# Patient Record
Sex: Female | Born: 1935
Health system: Southern US, Community
[De-identification: ages and names within clinical notes are randomized; demographics above are authoritative.]

---

## 2007-01-11 ENCOUNTER — Inpatient Hospital Stay: Payer: Self-pay | Admitting: Internal Medicine

## 2007-01-11 ENCOUNTER — Other Ambulatory Visit: Payer: Self-pay

## 2007-02-08 ENCOUNTER — Ambulatory Visit: Payer: Self-pay | Admitting: Gastroenterology

## 2007-12-08 ENCOUNTER — Other Ambulatory Visit: Payer: Self-pay

## 2007-12-08 ENCOUNTER — Emergency Department: Payer: Self-pay | Admitting: Emergency Medicine

## 2007-12-09 ENCOUNTER — Inpatient Hospital Stay: Payer: Self-pay | Admitting: Internal Medicine

## 2007-12-09 ENCOUNTER — Other Ambulatory Visit: Payer: Self-pay

## 2007-12-23 ENCOUNTER — Other Ambulatory Visit: Payer: Self-pay

## 2007-12-23 ENCOUNTER — Emergency Department: Payer: Self-pay | Admitting: Unknown Physician Specialty

## 2007-12-31 ENCOUNTER — Other Ambulatory Visit: Payer: Self-pay

## 2007-12-31 ENCOUNTER — Inpatient Hospital Stay: Payer: Self-pay | Admitting: Internal Medicine

## 2008-01-17 ENCOUNTER — Ambulatory Visit: Payer: Self-pay | Admitting: Gastroenterology

## 2009-04-08 ENCOUNTER — Inpatient Hospital Stay: Payer: Self-pay | Admitting: Internal Medicine

## 2009-04-17 ENCOUNTER — Inpatient Hospital Stay: Payer: Self-pay | Admitting: Internal Medicine

## 2010-12-06 ENCOUNTER — Emergency Department: Payer: Self-pay | Admitting: *Deleted

## 2010-12-17 ENCOUNTER — Emergency Department: Payer: Self-pay | Admitting: Emergency Medicine

## 2011-02-21 ENCOUNTER — Emergency Department: Payer: Self-pay | Admitting: Emergency Medicine

## 2011-09-28 ENCOUNTER — Emergency Department: Payer: Self-pay | Admitting: Emergency Medicine

## 2011-09-28 LAB — COMPREHENSIVE METABOLIC PANEL
Albumin: 3.6 g/dL (ref 3.4–5.0)
Alkaline Phosphatase: 60 U/L (ref 50–136)
Anion Gap: 6 — ABNORMAL LOW (ref 7–16)
Bilirubin,Total: 0.4 mg/dL (ref 0.2–1.0)
Calcium, Total: 9 mg/dL (ref 8.5–10.1)
Chloride: 101 mmol/L (ref 98–107)
Co2: 23 mmol/L (ref 21–32)
Creatinine: 1.01 mg/dL (ref 0.60–1.30)
EGFR (African American): >60
EGFR (Non-African Amer.): 55 — ABNORMAL LOW
Potassium: 4.9 mmol/L (ref 3.5–5.1)
SGPT (ALT): 18 U/L
Sodium: 130 mmol/L — ABNORMAL LOW (ref 136–145)
Total Protein: 6.5 g/dL (ref 6.4–8.2)

## 2011-09-28 LAB — CBC
HCT: 34.3 % — ABNORMAL LOW (ref 35.0–47.0)
HGB: 11.4 g/dL — ABNORMAL LOW (ref 12.0–16.0)
MCV: 93 fL (ref 80–100)
Platelet: 145 10*3/uL — ABNORMAL LOW (ref 150–440)
RBC: 3.71 10*6/uL — ABNORMAL LOW (ref 3.80–5.20)
WBC: 3.9 10*3/uL (ref 3.6–11.0)

## 2011-09-28 LAB — LIPASE, BLOOD: Lipase: 110 U/L (ref 73–393)

## 2012-05-30 ENCOUNTER — Emergency Department: Payer: Self-pay | Admitting: Emergency Medicine

## 2012-05-30 LAB — BASIC METABOLIC PANEL
Calcium, Total: 8.6 mg/dL (ref 8.5–10.1)
Chloride: 100 mmol/L (ref 98–107)
Co2: 23 mmol/L (ref 21–32)
EGFR (Non-African Amer.): 54 — ABNORMAL LOW
Glucose: 115 mg/dL — ABNORMAL HIGH (ref 65–99)
Osmolality: 262 (ref 275–301)
Potassium: 5.5 mmol/L — ABNORMAL HIGH (ref 3.5–5.1)
Sodium: 130 mmol/L — ABNORMAL LOW (ref 136–145)

## 2012-05-30 LAB — CBC
HGB: 12.9 g/dL (ref 12.0–16.0)
MCH: 31.4 pg (ref 26.0–34.0)
MCHC: 34.1 g/dL (ref 32.0–36.0)
Platelet: 170 10*3/uL (ref 150–440)
RDW: 13.8 % (ref 11.5–14.5)

## 2012-05-30 LAB — CK TOTAL AND CKMB (NOT AT ARMC): CK, Total: 47 U/L (ref 21–215)

## 2012-07-11 ENCOUNTER — Ambulatory Visit: Payer: Self-pay | Admitting: Surgery

## 2012-10-15 LAB — COMPREHENSIVE METABOLIC PANEL
Alkaline Phosphatase: 79 U/L (ref 50–136)
Anion Gap: 5 — ABNORMAL LOW (ref 7–16)
Creatinine: 1.43 mg/dL — ABNORMAL HIGH (ref 0.60–1.30)
Osmolality: 273 (ref 275–301)
SGOT(AST): 18 U/L (ref 15–37)
SGPT (ALT): 14 U/L (ref 12–78)
Total Protein: 6.8 g/dL (ref 6.4–8.2)

## 2012-10-15 LAB — URINALYSIS, COMPLETE
Bacteria: NONE SEEN
Blood: NEGATIVE
Glucose,UR: NEGATIVE mg/dL (ref 0–75)
Hyaline Cast: 24
Ketone: NEGATIVE
Ph: 5 (ref 4.5–8.0)
Protein: 30
RBC,UR: 4 /HPF (ref 0–5)
Specific Gravity: 1.023 (ref 1.003–1.030)
WBC UR: 45 /HPF (ref 0–5)

## 2012-10-15 LAB — CBC
HCT: 36.3 % (ref 35.0–47.0)
MCH: 30.2 pg (ref 26.0–34.0)
MCV: 91 fL (ref 80–100)
RBC: 4.01 10*6/uL (ref 3.80–5.20)
WBC: 7.7 10*3/uL (ref 3.6–11.0)

## 2012-10-15 LAB — CK TOTAL AND CKMB (NOT AT ARMC): CK, Total: 125 U/L (ref 21–215)

## 2012-10-15 LAB — TROPONIN I: Troponin-I: <0.02 ng/mL

## 2012-10-16 ENCOUNTER — Inpatient Hospital Stay: Payer: Self-pay | Admitting: Internal Medicine

## 2012-10-16 LAB — CK TOTAL AND CKMB (NOT AT ARMC)
CK, Total: 119 U/L (ref 21–215)
CK, Total: 103 U/L (ref 21–215)
CK-MB: 1.3 ng/mL (ref 0.5–3.6)
CK-MB: 1.2 ng/mL (ref 0.5–3.6)

## 2012-10-17 DIAGNOSIS — I059 Rheumatic mitral valve disease, unspecified: Secondary | ICD-10-CM

## 2012-10-17 LAB — BASIC METABOLIC PANEL
Calcium, Total: 8.2 mg/dL — ABNORMAL LOW (ref 8.5–10.1)
Chloride: 104 mmol/L (ref 98–107)
Co2: 24 mmol/L (ref 21–32)
Glucose: 76 mg/dL (ref 65–99)
Osmolality: 267 (ref 275–301)
Potassium: 4.9 mmol/L (ref 3.5–5.1)
Sodium: 134 mmol/L — ABNORMAL LOW (ref 136–145)

## 2012-10-17 LAB — URINE CULTURE

## 2012-10-22 ENCOUNTER — Emergency Department: Payer: Self-pay | Admitting: Emergency Medicine

## 2012-10-22 LAB — CBC
HCT: 34 % — ABNORMAL LOW (ref 35.0–47.0)
MCHC: 33.4 g/dL (ref 32.0–36.0)
Platelet: 176 10*3/uL (ref 150–440)
RBC: 3.78 10*6/uL — ABNORMAL LOW (ref 3.80–5.20)
RDW: 13.5 % (ref 11.5–14.5)
WBC: 5.5 10*3/uL (ref 3.6–11.0)

## 2012-10-22 LAB — COMPREHENSIVE METABOLIC PANEL
Alkaline Phosphatase: 67 U/L (ref 50–136)
Anion Gap: 6 — ABNORMAL LOW (ref 7–16)
Bilirubin,Total: 0.2 mg/dL (ref 0.2–1.0)
Creatinine: 1.16 mg/dL (ref 0.60–1.30)
EGFR (African American): 49 — ABNORMAL LOW
Glucose: 93 mg/dL (ref 65–99)
Osmolality: 264 (ref 275–301)
Potassium: 4.9 mmol/L (ref 3.5–5.1)
SGOT(AST): 14 U/L — ABNORMAL LOW (ref 15–37)
SGPT (ALT): 16 U/L (ref 12–78)
Total Protein: 6.5 g/dL (ref 6.4–8.2)

## 2012-10-22 LAB — URINALYSIS, COMPLETE
Bilirubin,UR: NEGATIVE
Hyaline Cast: 18
Nitrite: NEGATIVE
Protein: NEGATIVE
Specific Gravity: 1.026 (ref 1.003–1.030)
WBC UR: 13 /HPF (ref 0–5)

## 2012-10-22 LAB — TROPONIN I: Troponin-I: <0.02 ng/mL

## 2012-10-22 LAB — CK TOTAL AND CKMB (NOT AT ARMC): CK, Total: 59 U/L (ref 21–215)

## 2012-11-03 ENCOUNTER — Emergency Department: Payer: Self-pay | Admitting: Emergency Medicine

## 2012-11-03 LAB — COMPREHENSIVE METABOLIC PANEL
Albumin: 3.4 g/dL (ref 3.4–5.0)
Alkaline Phosphatase: 78 U/L (ref 50–136)
Anion Gap: 5 — ABNORMAL LOW (ref 7–16)
Bilirubin,Total: 0.3 mg/dL (ref 0.2–1.0)
Calcium, Total: 8.5 mg/dL (ref 8.5–10.1)
Chloride: 100 mmol/L (ref 98–107)
Co2: 26 mmol/L (ref 21–32)
Creatinine: 0.86 mg/dL (ref 0.60–1.30)
EGFR (African American): >60
EGFR (Non-African Amer.): >60
SGOT(AST): 21 U/L (ref 15–37)
SGPT (ALT): 13 U/L (ref 12–78)
Sodium: 131 mmol/L — ABNORMAL LOW (ref 136–145)
Total Protein: 6.9 g/dL (ref 6.4–8.2)

## 2012-11-03 LAB — CBC
HCT: 35.6 % (ref 35.0–47.0)
MCH: 29.4 pg (ref 26.0–34.0)
MCHC: 33.2 g/dL (ref 32.0–36.0)
MCV: 89 fL (ref 80–100)
RBC: 4.02 10*6/uL (ref 3.80–5.20)

## 2012-11-03 LAB — URINALYSIS, COMPLETE
Bilirubin,UR: NEGATIVE
Glucose,UR: NEGATIVE mg/dL (ref 0–75)
Leukocyte Esterase: NEGATIVE
Ph: 5 (ref 4.5–8.0)
Protein: NEGATIVE
RBC,UR: <1 /HPF (ref 0–5)
Specific Gravity: 1.012 (ref 1.003–1.030)
Squamous Epithelial: <1
WBC UR: 1 /HPF (ref 0–5)

## 2012-11-03 LAB — TROPONIN I: Troponin-I: <0.02 ng/mL

## 2013-05-14 ENCOUNTER — Observation Stay: Payer: Self-pay | Admitting: Internal Medicine

## 2013-05-14 LAB — URINALYSIS, COMPLETE
Blood: NEGATIVE
Glucose,UR: NEGATIVE mg/dL (ref 0–75)
Ph: 8 (ref 4.5–8.0)
Protein: NEGATIVE
RBC,UR: 2 /HPF (ref 0–5)
Specific Gravity: 1.013 (ref 1.003–1.030)
Squamous Epithelial: <1
WBC UR: 3 /HPF (ref 0–5)

## 2013-05-14 LAB — CBC
HCT: 33.3 % — ABNORMAL LOW (ref 35.0–47.0)
MCH: 29 pg (ref 26.0–34.0)
MCHC: 34.3 g/dL (ref 32.0–36.0)
Platelet: 209 10*3/uL (ref 150–440)
WBC: 8.7 10*3/uL (ref 3.6–11.0)

## 2013-05-14 LAB — COMPREHENSIVE METABOLIC PANEL
Albumin: 3.1 g/dL — ABNORMAL LOW (ref 3.4–5.0)
Alkaline Phosphatase: 75 U/L (ref 50–136)
BUN: 11 mg/dL (ref 7–18)
Bilirubin,Total: 0.4 mg/dL (ref 0.2–1.0)
Calcium, Total: 8.8 mg/dL (ref 8.5–10.1)
Chloride: 101 mmol/L (ref 98–107)
Co2: 24 mmol/L (ref 21–32)
EGFR (African American): 59 — ABNORMAL LOW
EGFR (Non-African Amer.): 51 — ABNORMAL LOW
Glucose: 123 mg/dL — ABNORMAL HIGH (ref 65–99)
Osmolality: 262 (ref 275–301)
Potassium: 4.6 mmol/L (ref 3.5–5.1)
SGOT(AST): 18 U/L (ref 15–37)
SGPT (ALT): 13 U/L (ref 12–78)
Sodium: 130 mmol/L — ABNORMAL LOW (ref 136–145)

## 2013-05-14 LAB — PROTIME-INR: Prothrombin Time: 14.1 secs (ref 11.5–14.7)

## 2013-05-14 LAB — TROPONIN I: Troponin-I: <0.02 ng/mL

## 2013-05-14 LAB — CK TOTAL AND CKMB (NOT AT ARMC): CK-MB: 0.7 ng/mL (ref 0.5–3.6)

## 2013-05-15 LAB — CBC WITH DIFFERENTIAL/PLATELET
Basophil #: 0 10*3/uL (ref 0.0–0.1)
Eosinophil #: 0.1 10*3/uL (ref 0.0–0.7)
HCT: 32.4 % — ABNORMAL LOW (ref 35.0–47.0)
HGB: 11 g/dL — ABNORMAL LOW (ref 12.0–16.0)
Lymphocyte #: 0.7 10*3/uL — ABNORMAL LOW (ref 1.0–3.6)
Lymphocyte %: 12.9 %
MCH: 28.8 pg (ref 26.0–34.0)
MCHC: 33.8 g/dL (ref 32.0–36.0)
Monocyte #: 0.5 x10 3/mm (ref 0.2–0.9)
Monocyte %: 8.6 %
Neutrophil #: 4.3 10*3/uL (ref 1.4–6.5)
Platelet: 201 10*3/uL (ref 150–440)
RDW: 14.4 % (ref 11.5–14.5)
WBC: 5.6 10*3/uL (ref 3.6–11.0)

## 2013-05-15 LAB — COMPREHENSIVE METABOLIC PANEL
Anion Gap: 7 (ref 7–16)
Bilirubin,Total: 0.4 mg/dL (ref 0.2–1.0)
Co2: 25 mmol/L (ref 21–32)
EGFR (Non-African Amer.): 57 — ABNORMAL LOW
Osmolality: 264 (ref 275–301)
Potassium: 4.3 mmol/L (ref 3.5–5.1)

## 2013-05-15 LAB — TROPONIN I: Troponin-I: <0.02 ng/mL

## 2013-09-24 ENCOUNTER — Observation Stay: Payer: Self-pay | Admitting: Specialist

## 2013-09-24 LAB — URINALYSIS, COMPLETE
BLOOD: NEGATIVE
Bilirubin,UR: NEGATIVE
Glucose,UR: NEGATIVE mg/dL (ref 0–75)
KETONE: NEGATIVE
NITRITE: NEGATIVE
PROTEIN: NEGATIVE
Ph: 7 (ref 4.5–8.0)
RBC, UR: NONE SEEN /HPF (ref 0–5)
Specific Gravity: 1.011 (ref 1.003–1.030)

## 2013-09-24 LAB — BASIC METABOLIC PANEL
Anion Gap: 4 — ABNORMAL LOW (ref 7–16)
BUN: 9 mg/dL (ref 7–18)
CHLORIDE: 96 mmol/L — AB (ref 98–107)
CO2: 27 mmol/L (ref 21–32)
Calcium, Total: 9.2 mg/dL (ref 8.5–10.1)
Creatinine: 0.87 mg/dL (ref 0.60–1.30)
EGFR (Non-African Amer.): 60 — ABNORMAL LOW
GLUCOSE: 108 mg/dL — AB (ref 65–99)
Osmolality: 254 (ref 275–301)
Potassium: 4.7 mmol/L (ref 3.5–5.1)
Sodium: 127 mmol/L — ABNORMAL LOW (ref 136–145)

## 2013-09-24 LAB — CBC
HCT: 32.6 % — AB (ref 35.0–47.0)
HGB: 10.9 g/dL — ABNORMAL LOW (ref 12.0–16.0)
MCV: 82 fL (ref 80–100)
Platelet: 252 10*3/uL (ref 150–440)
RBC: 3.96 10*6/uL (ref 3.80–5.20)
WBC: 6.8 10*3/uL (ref 3.6–11.0)

## 2013-09-24 LAB — OSMOLALITY: Osmolality: 262 mOsm/kg — ABNORMAL LOW (ref 280–301)

## 2013-09-24 LAB — TROPONIN I: Troponin-I: <0.02 ng/mL

## 2013-09-25 LAB — BASIC METABOLIC PANEL
ANION GAP: 4 — AB (ref 7–16)
BUN: 6 mg/dL — ABNORMAL LOW (ref 7–18)
Calcium, Total: 8.4 mg/dL — ABNORMAL LOW (ref 8.5–10.1)
Chloride: 99 mmol/L (ref 98–107)
Co2: 27 mmol/L (ref 21–32)
Creatinine: 0.79 mg/dL (ref 0.60–1.30)
EGFR (African American): >60
GLUCOSE: 86 mg/dL (ref 65–99)
OSMOLALITY: 258 (ref 275–301)
Potassium: 4.7 mmol/L (ref 3.5–5.1)
Sodium: 130 mmol/L — ABNORMAL LOW (ref 136–145)

## 2013-09-25 LAB — OSMOLALITY, URINE: OSMOLALITY: 320 mosm/kg

## 2013-09-26 LAB — BASIC METABOLIC PANEL
Anion Gap: 4 — ABNORMAL LOW (ref 7–16)
BUN: 5 mg/dL — ABNORMAL LOW (ref 7–18)
CHLORIDE: 99 mmol/L (ref 98–107)
CO2: 26 mmol/L (ref 21–32)
Calcium, Total: 8.7 mg/dL (ref 8.5–10.1)
Creatinine: 0.8 mg/dL (ref 0.60–1.30)
EGFR (Non-African Amer.): >60
Glucose: 80 mg/dL (ref 65–99)
Osmolality: 255 (ref 275–301)
POTASSIUM: 4.5 mmol/L (ref 3.5–5.1)
Sodium: 129 mmol/L — ABNORMAL LOW (ref 136–145)

## 2013-10-18 LAB — URINE CULTURE

## 2013-12-02 ENCOUNTER — Emergency Department: Payer: Self-pay | Admitting: Emergency Medicine

## 2013-12-02 LAB — BASIC METABOLIC PANEL
Anion Gap: 6 — ABNORMAL LOW (ref 7–16)
BUN: 9 mg/dL (ref 7–18)
CO2: 28 mmol/L (ref 21–32)
Calcium, Total: 8.7 mg/dL (ref 8.5–10.1)
Chloride: 99 mmol/L (ref 98–107)
Creatinine: 0.71 mg/dL (ref 0.60–1.30)
EGFR (African American): >60
Glucose: 89 mg/dL (ref 65–99)
Osmolality: 265 (ref 275–301)
POTASSIUM: 4.5 mmol/L (ref 3.5–5.1)
Sodium: 133 mmol/L — ABNORMAL LOW (ref 136–145)

## 2013-12-02 LAB — TROPONIN I: Troponin-I: <0.02 ng/mL

## 2013-12-02 LAB — CBC
HCT: 31.4 % — AB (ref 35.0–47.0)
HGB: 10.4 g/dL — ABNORMAL LOW (ref 12.0–16.0)
MCH: 27.3 pg (ref 26.0–34.0)
MCHC: 33.2 g/dL (ref 32.0–36.0)
MCV: 82 fL (ref 80–100)
Platelet: 302 10*3/uL (ref 150–440)
RBC: 3.81 10*6/uL (ref 3.80–5.20)
RDW: 17.1 % — AB (ref 11.5–14.5)
WBC: 6.2 10*3/uL (ref 3.6–11.0)

## 2013-12-12 ENCOUNTER — Ambulatory Visit: Payer: Self-pay | Admitting: Family Medicine

## 2013-12-12 LAB — URINALYSIS, COMPLETE
BLOOD: NEGATIVE
Bacteria: NEGATIVE
Bilirubin,UR: NEGATIVE
GLUCOSE, UR: NEGATIVE mg/dL (ref 0–75)
Ketone: NEGATIVE
Leukocyte Esterase: NEGATIVE
Nitrite: NEGATIVE
PROTEIN: NEGATIVE
Ph: 7.5 (ref 4.5–8.0)
SPECIFIC GRAVITY: 1.005 (ref 1.003–1.030)
Squamous Epithelial: NONE SEEN

## 2013-12-14 LAB — URINE CULTURE

## 2013-12-24 ENCOUNTER — Emergency Department: Payer: Self-pay | Admitting: Emergency Medicine

## 2013-12-24 LAB — COMPREHENSIVE METABOLIC PANEL
Albumin: 2.6 g/dL — ABNORMAL LOW (ref 3.4–5.0)
Alkaline Phosphatase: 60 U/L
Anion Gap: 3 — ABNORMAL LOW (ref 7–16)
BILIRUBIN TOTAL: 0.4 mg/dL (ref 0.2–1.0)
BUN: 10 mg/dL (ref 7–18)
CO2: 28 mmol/L (ref 21–32)
Calcium, Total: 8.6 mg/dL (ref 8.5–10.1)
Chloride: 100 mmol/L (ref 98–107)
Creatinine: 0.94 mg/dL (ref 0.60–1.30)
GFR CALC NON AF AMER: 55 — AB
GLUCOSE: 89 mg/dL (ref 65–99)
OSMOLALITY: 261 (ref 275–301)
Potassium: 4.3 mmol/L (ref 3.5–5.1)
SGOT(AST): 20 U/L (ref 15–37)
SGPT (ALT): 9 U/L — ABNORMAL LOW (ref 12–78)
Sodium: 131 mmol/L — ABNORMAL LOW (ref 136–145)
TOTAL PROTEIN: 6.4 g/dL (ref 6.4–8.2)

## 2013-12-24 LAB — URINALYSIS, COMPLETE
BACTERIA: NONE SEEN
BILIRUBIN, UR: NEGATIVE
Blood: NEGATIVE
Glucose,UR: NEGATIVE mg/dL (ref 0–75)
KETONE: NEGATIVE
Leukocyte Esterase: NEGATIVE
Nitrite: NEGATIVE
Ph: 8 (ref 4.5–8.0)
Protein: NEGATIVE
SPECIFIC GRAVITY: 1.008 (ref 1.003–1.030)
Squamous Epithelial: <1
WBC UR: 1 /HPF (ref 0–5)

## 2013-12-24 LAB — CBC
HCT: 31.6 % — AB (ref 35.0–47.0)
HGB: 10.3 g/dL — ABNORMAL LOW (ref 12.0–16.0)
MCH: 27.3 pg (ref 26.0–34.0)
MCHC: 32.7 g/dL (ref 32.0–36.0)
MCV: 83 fL (ref 80–100)
PLATELETS: 288 10*3/uL (ref 150–440)
RBC: 3.79 10*6/uL — ABNORMAL LOW (ref 3.80–5.20)
RDW: 16.9 % — ABNORMAL HIGH (ref 11.5–14.5)
WBC: 6.6 10*3/uL (ref 3.6–11.0)

## 2013-12-24 LAB — PRO B NATRIURETIC PEPTIDE: B-TYPE NATIURETIC PEPTID: 736 pg/mL — AB (ref 0–450)

## 2013-12-24 LAB — TROPONIN I: Troponin-I: <0.02 ng/mL

## 2014-01-26 ENCOUNTER — Emergency Department: Payer: Self-pay | Admitting: Emergency Medicine

## 2014-01-26 LAB — CBC WITH DIFFERENTIAL/PLATELET
BASOS ABS: 0.1 10*3/uL (ref 0.0–0.1)
Basophil %: 1 %
Eosinophil #: 0.1 10*3/uL (ref 0.0–0.7)
Eosinophil %: 0.9 %
HCT: 33.5 % — AB (ref 35.0–47.0)
HGB: 10.5 g/dL — AB (ref 12.0–16.0)
LYMPHS ABS: 0.6 10*3/uL — AB (ref 1.0–3.6)
Lymphocyte %: 10.6 %
MCH: 25.9 pg — ABNORMAL LOW (ref 26.0–34.0)
MCHC: 31.2 g/dL — ABNORMAL LOW (ref 32.0–36.0)
MCV: 83 fL (ref 80–100)
Monocyte #: 0.5 x10 3/mm (ref 0.2–0.9)
Monocyte %: 9.2 %
NEUTROS ABS: 4.6 10*3/uL (ref 1.4–6.5)
Neutrophil %: 78.3 %
Platelet: 284 10*3/uL (ref 150–440)
RBC: 4.04 10*6/uL (ref 3.80–5.20)
RDW: 16.1 % — ABNORMAL HIGH (ref 11.5–14.5)
WBC: 5.9 10*3/uL (ref 3.6–11.0)

## 2014-01-26 LAB — BASIC METABOLIC PANEL
ANION GAP: 7 (ref 7–16)
BUN: 8 mg/dL (ref 7–18)
Calcium, Total: 9 mg/dL (ref 8.5–10.1)
Chloride: 95 mmol/L — ABNORMAL LOW (ref 98–107)
Co2: 25 mmol/L (ref 21–32)
Creatinine: 0.77 mg/dL (ref 0.60–1.30)
EGFR (African American): >60
GLUCOSE: 88 mg/dL (ref 65–99)
Osmolality: 253 (ref 275–301)
Potassium: 4.1 mmol/L (ref 3.5–5.1)
SODIUM: 127 mmol/L — AB (ref 136–145)

## 2014-01-26 LAB — TROPONIN I

## 2014-01-26 LAB — URINALYSIS, COMPLETE
Bilirubin,UR: NEGATIVE
GLUCOSE, UR: NEGATIVE mg/dL (ref 0–75)
Ketone: NEGATIVE
Nitrite: NEGATIVE
PROTEIN: NEGATIVE
Ph: 8 (ref 4.5–8.0)
RBC,UR: 3 /HPF (ref 0–5)
SQUAMOUS EPITHELIAL: NONE SEEN
Specific Gravity: 1.006 (ref 1.003–1.030)
WBC UR: 62 /HPF (ref 0–5)

## 2014-02-23 ENCOUNTER — Emergency Department: Payer: Self-pay | Admitting: Emergency Medicine

## 2014-02-23 LAB — BASIC METABOLIC PANEL
Anion Gap: 9 (ref 7–16)
BUN: 13 mg/dL (ref 7–18)
Calcium, Total: 8.5 mg/dL (ref 8.5–10.1)
Chloride: 99 mmol/L (ref 98–107)
Co2: 25 mmol/L (ref 21–32)
Creatinine: 1.06 mg/dL (ref 0.60–1.30)
EGFR (African American): 54 — ABNORMAL LOW
GFR CALC NON AF AMER: 47 — AB
Glucose: 76 mg/dL (ref 65–99)
Osmolality: 265 (ref 275–301)
Potassium: 4.2 mmol/L (ref 3.5–5.1)
Sodium: 133 mmol/L — ABNORMAL LOW (ref 136–145)

## 2014-02-23 LAB — CBC
HCT: 30.9 % — ABNORMAL LOW (ref 35.0–47.0)
HGB: 9.9 g/dL — ABNORMAL LOW (ref 12.0–16.0)
MCH: 26.5 pg (ref 26.0–34.0)
MCHC: 32 g/dL (ref 32.0–36.0)
MCV: 83 fL (ref 80–100)
PLATELETS: 313 10*3/uL (ref 150–440)
RBC: 3.72 10*6/uL — ABNORMAL LOW (ref 3.80–5.20)
RDW: 16.6 % — ABNORMAL HIGH (ref 11.5–14.5)
WBC: 6 10*3/uL (ref 3.6–11.0)

## 2014-02-25 ENCOUNTER — Emergency Department: Payer: Self-pay | Admitting: Emergency Medicine

## 2014-02-25 LAB — URINALYSIS, COMPLETE
Bacteria: NONE SEEN
Bilirubin,UR: NEGATIVE
GLUCOSE, UR: NEGATIVE mg/dL (ref 0–75)
KETONE: NEGATIVE
LEUKOCYTE ESTERASE: NEGATIVE
Nitrite: NEGATIVE
Ph: 6 (ref 4.5–8.0)
Protein: NEGATIVE
RBC,UR: 17 /HPF (ref 0–5)
SPECIFIC GRAVITY: 1.011 (ref 1.003–1.030)
Squamous Epithelial: NONE SEEN
WBC UR: NONE SEEN /HPF (ref 0–5)

## 2014-02-25 LAB — CBC WITH DIFFERENTIAL/PLATELET
Basophil #: 0 10*3/uL (ref 0.0–0.1)
Basophil %: 0.4 %
Eosinophil #: 0 10*3/uL (ref 0.0–0.7)
Eosinophil %: 0.8 %
HCT: 31.6 % — AB (ref 35.0–47.0)
HGB: 10 g/dL — AB (ref 12.0–16.0)
Lymphocyte #: 0.6 10*3/uL — ABNORMAL LOW (ref 1.0–3.6)
Lymphocyte %: 9.9 %
MCH: 26.1 pg (ref 26.0–34.0)
MCHC: 31.5 g/dL — AB (ref 32.0–36.0)
MCV: 83 fL (ref 80–100)
MONOS PCT: 9 %
Monocyte #: 0.5 x10 3/mm (ref 0.2–0.9)
NEUTROS ABS: 4.8 10*3/uL (ref 1.4–6.5)
Neutrophil %: 79.9 %
Platelet: 295 10*3/uL (ref 150–440)
RBC: 3.81 10*6/uL (ref 3.80–5.20)
RDW: 16.8 % — AB (ref 11.5–14.5)
WBC: 6 10*3/uL (ref 3.6–11.0)

## 2014-02-25 LAB — COMPREHENSIVE METABOLIC PANEL
ALT: 19 U/L (ref 12–78)
AST: 30 U/L (ref 15–37)
Albumin: 2.2 g/dL — ABNORMAL LOW (ref 3.4–5.0)
Alkaline Phosphatase: 75 U/L
Anion Gap: 8 (ref 7–16)
BUN: 14 mg/dL (ref 7–18)
Bilirubin,Total: 0.5 mg/dL (ref 0.2–1.0)
CALCIUM: 8.4 mg/dL — AB (ref 8.5–10.1)
CHLORIDE: 98 mmol/L (ref 98–107)
CREATININE: 1.06 mg/dL (ref 0.60–1.30)
Co2: 27 mmol/L (ref 21–32)
EGFR (African American): 54 — ABNORMAL LOW
EGFR (Non-African Amer.): 47 — ABNORMAL LOW
GLUCOSE: 76 mg/dL (ref 65–99)
Osmolality: 266 (ref 275–301)
POTASSIUM: 4.3 mmol/L (ref 3.5–5.1)
Sodium: 133 mmol/L — ABNORMAL LOW (ref 136–145)
Total Protein: 6.7 g/dL (ref 6.4–8.2)

## 2014-02-25 LAB — TROPONIN I: Troponin-I: <0.02 ng/mL

## 2014-02-25 LAB — LIPASE, BLOOD: LIPASE: 116 U/L (ref 73–393)

## 2014-04-09 DEATH — deceased

## 2014-09-13 IMAGING — CT CT CHEST W/ CM
2 series · 15 of 31 positions shown, 19 images · IV contrast (APPLIED)
Comparison: none

REASON FOR EXAM: right chest pain, dyspnea
COMMENTS:

[Series 4: soft tissue · axial · 0.64mm/px · z∈[+867,+909]mm · 2 of 96 slices shown]
[im 8/96  mediastinal]
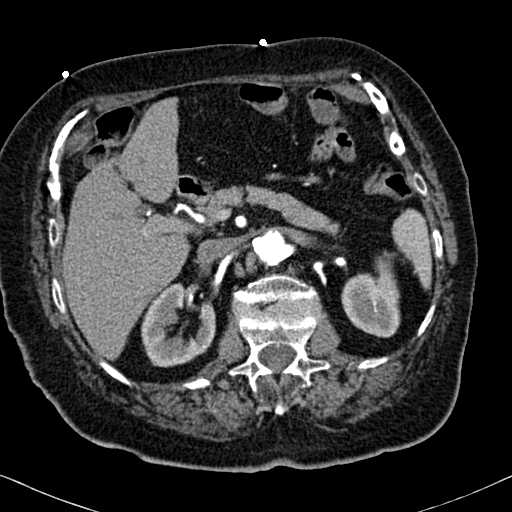
[im 22/96  mediastinal]
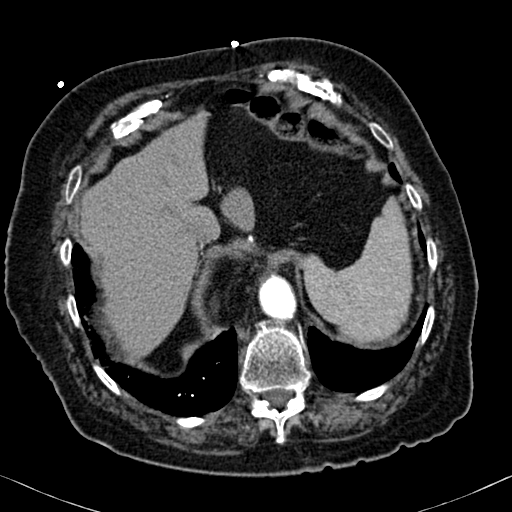

[Series 5: lung windows · axial · 0.64mm/px · z∈[+876,+1107]mm · 13 of 93 slices shown, 17 images]
[im 8/93  mediastinal]
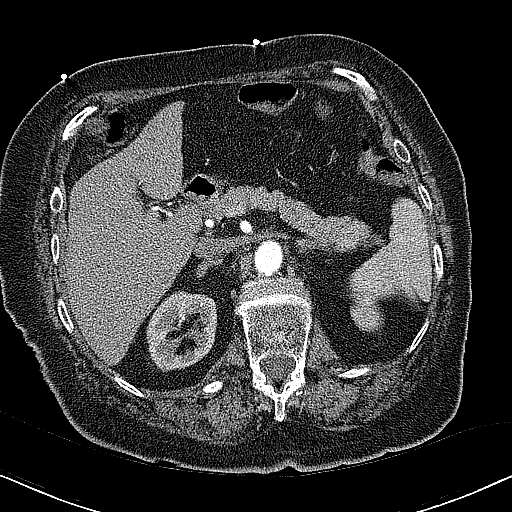
[im 8/93  lung]
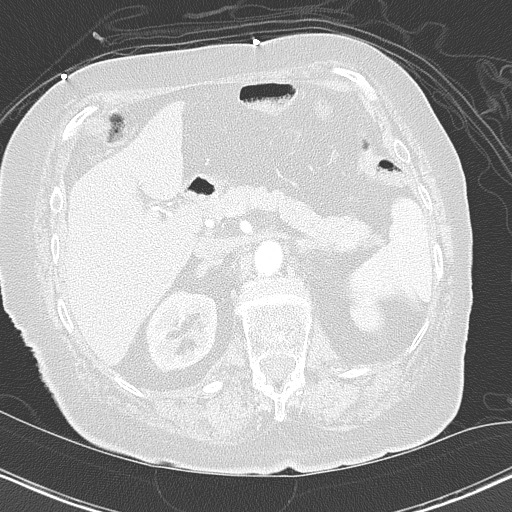
[im 15/93  lung]
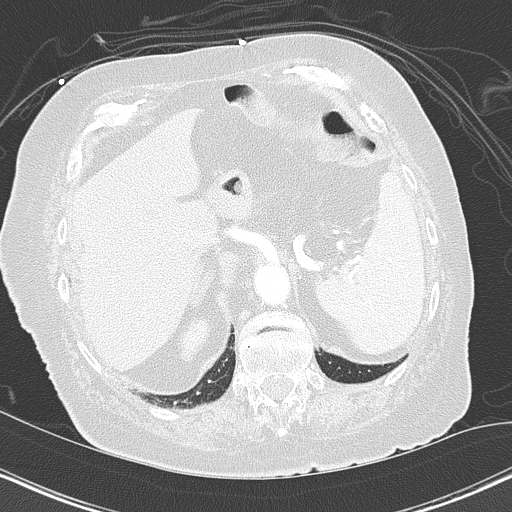
[im 22/93  lung]
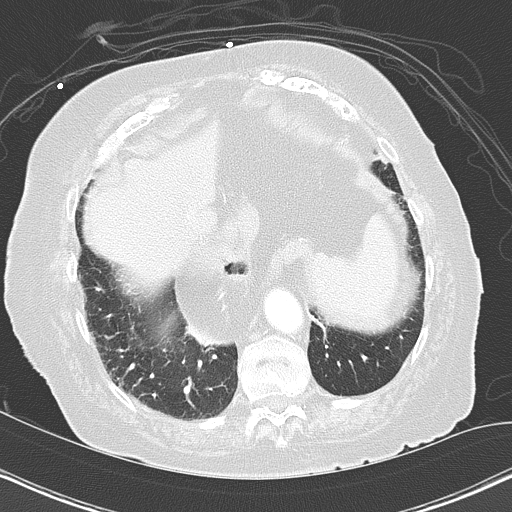
[im 29/93  lung]
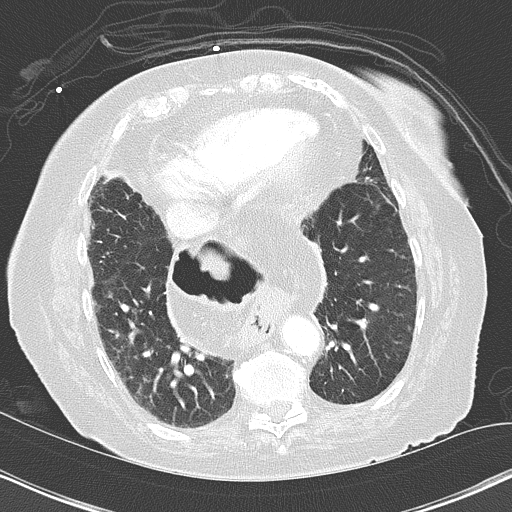
[im 36/93  mediastinal]
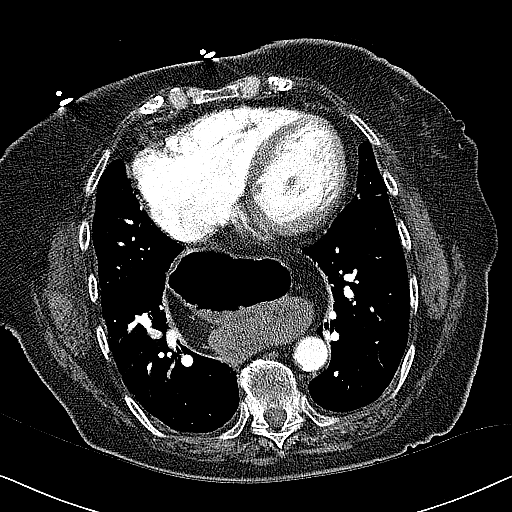
[im 36/93  lung]
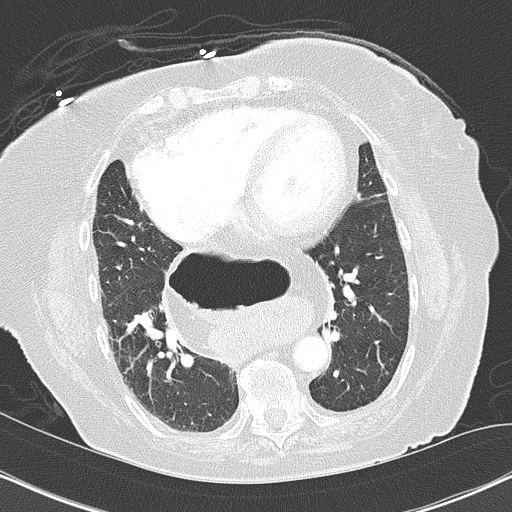
[im 43/93  lung]
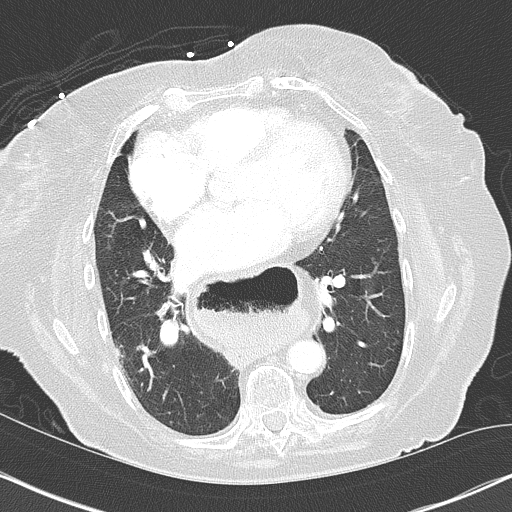
[im 47/93  lung]
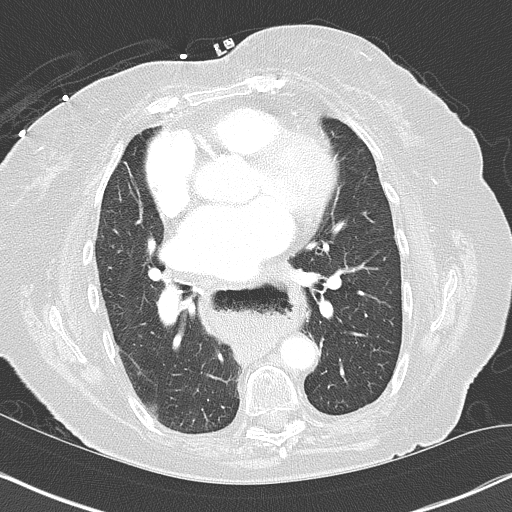
[im 50/93  lung]
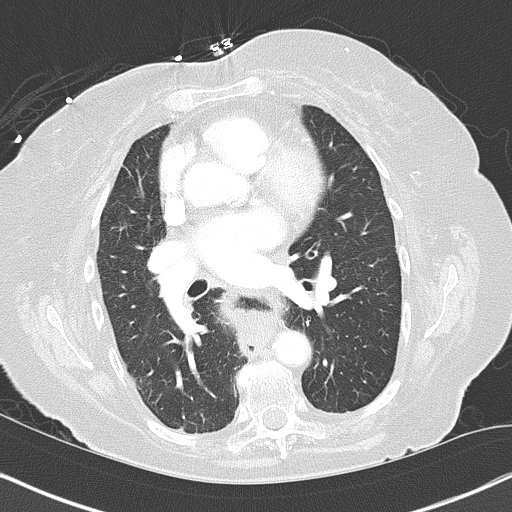
[im 57/93  mediastinal]
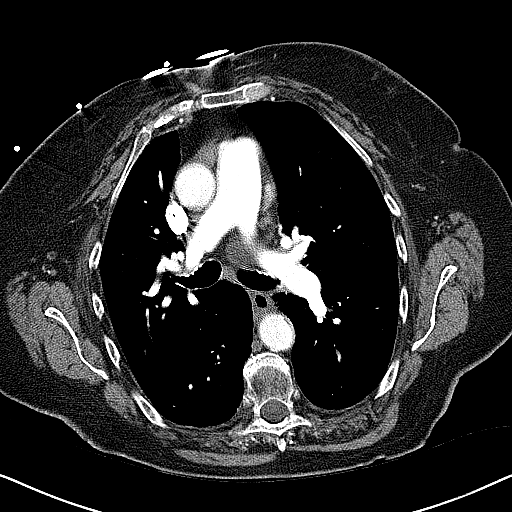
[im 57/93  lung]
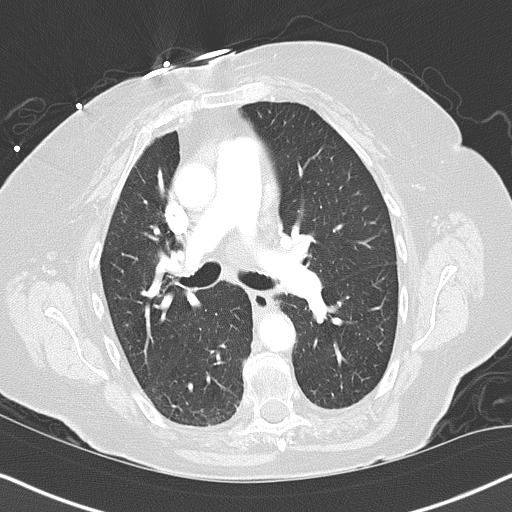
[im 64/93  lung]
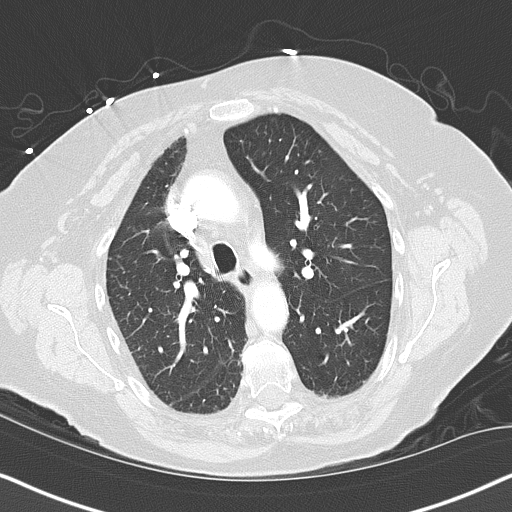
[im 71/93  lung]
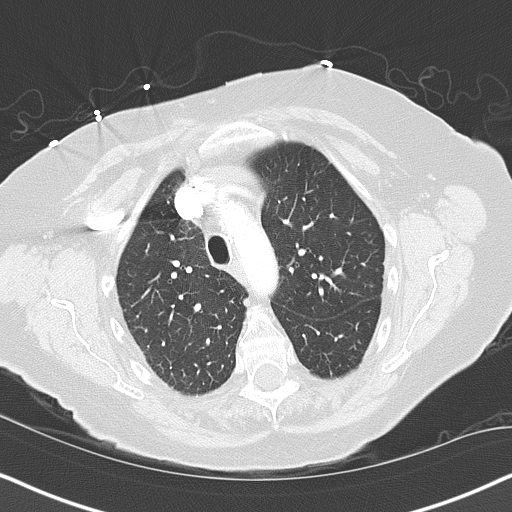
[im 78/93  lung]
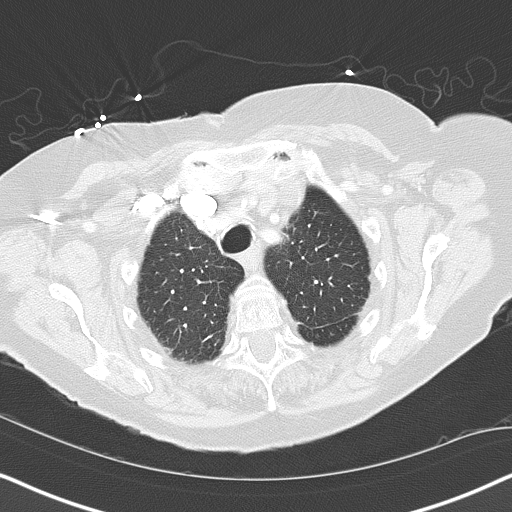
[im 85/93  mediastinal]
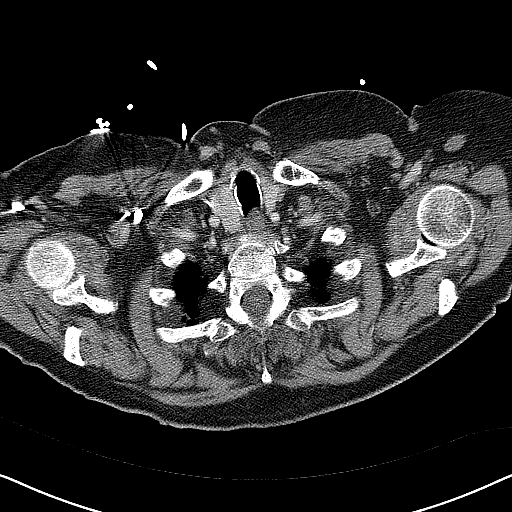
[im 85/93  lung]
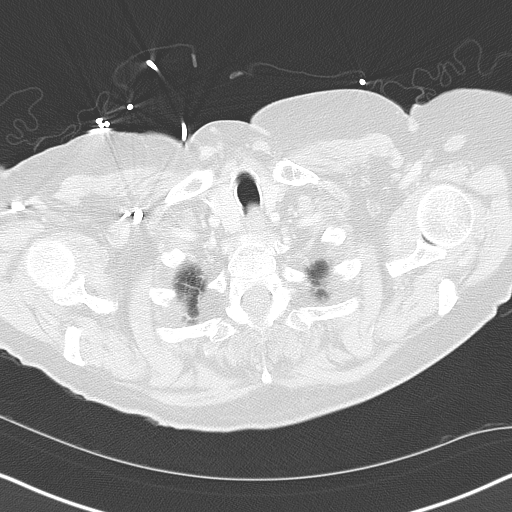

[15 of 31 positions shown; findings below may reference images not displayed]

PROCEDURE:     CT  - CT CHEST (FOR PE) W  - May 31, 2012  [DATE]

RESULT:     Axial CT scanning was performed through the chest with
reconstructions at 3 mm intervals and slice thicknesses. Review of
multiplanar reconstructed images was performed separately on the VIA
monitor. The patient received 100 cc of Tsovue-0M5.

The cardiac chambers are mildly enlarged. There is a large hiatal
hernia-partially intrathoracic stomach. The caliber of the thoracic aorta is
normal. Contrast within the pulmonary arterial tree is normal. There is are
no pathologic sized mediastinal or hilar lymph nodes. There is no pleural
nor pericardial effusion.

At lung window settings there are emphysematous changes bilaterally. Minimal
subpleural atelectasis is present in both lungs.

Within the upper abdomen the observed portions of the liver and spleen and
adrenal glands appear normal. There is prominent thoracic kyphosis.
IMPRESSION: 1. There is no evidence of an acute pulmonary embolism nor acute thoracic
aortic pathology.
2. There is enlargement of the cardiac chambers. There is no overt evidence
of CHF.
3. There is a large hiatal hernia-partially intrathoracic stomach.
4. The lungs exhibit changes of COPD. Minimal subpleural atelectatic change
is seen in the right lung.

A preliminary report was sent to the [HOSPITAL] the conclusion
of the study.

[REDACTED]

## 2014-11-29 NOTE — H&P (Signed)
PATIENT NAME:  Tonya Bolton, Tonya Bolton MR#:  [id number] DATE OF BIRTH:  04/03/1924  DATE OF ADMISSION:  10/15/2012  PRIMARY CARE PHYSICIAN: Elizabeth Sauer, M.D.   CHIEF COMPLAINT: Syncope.   HISTORY OF PRESENT ILLNESS: The patient is an 81 year old pleasant white female with a past medical history of diabetes mellitus on oral medication, hypertension, hyperlipidemia, coronary artery disease, who presented to the Emergency Department after having an episode of syncope at home. The patient was sitting in a chair outside the house, stood up to walk into the house. The patient had lost consciousness, fell down to the floor. The patient's son, who witnessed this event, stated that loss of consciousness might have been 1 to 2 minutes. The patient regained consciousness by herself. Considering this event, EMS was called and was brought to the Emergency Department. Initially, the patient's blood pressure was in the high 90s. Initial EKG showed T wave inversions in the inferior leads. The patient was given IV fluids. Repeat EKG showed dissolution of the EKG changes. The patient is a poor historian secondary to dementia. However, currently denies having any chest pain, palpitations, nausea, vomiting. The patient may not have been eating and drinking for the last 2 days, as they did not have power at home. The patient states has some dysuria, some abdominal pain in the suprapubic area. Workup in the Emergency Department consistent with urinary tract infection with 3+ leukocyte esterase and WBC of 4 to 5. The patient had a similar episode a few years back, uncertain about the workup. There was no seizure activity.   PAST MEDICAL HISTORY:  1.  Hypertension.  2.  Diabetes mellitus.  3.  Coronary artery disease, obstructive of LAD 95% and the first diagonal with 75% stenosis. Other vessels 25% stenosis by left heart cath done in 2010 by Dr. Juliann Pares. Recommended medical management at the time.  4.  Mild dementia.   HOME  MEDICATIONS:  1.  Omeprazole 20 mg, 1 tablet b.i.d. 2.  Lovastatin 10 mg daily.  3.  Losartan 50 mg 2 times daily.   4.  Imdur 60 mg daily.  5.  Plavix 75 mg daily.  6.  Coreg 3.125 mg b.i.d.  7.  Aspirin 81 mg daily.   ALLERGIES: PENICILLIN.   SOCIAL HISTORY: No history of smoking, drinking alcohol or using illicit drugs. Currently lives with her 2 sons and a nephew.   FAMILY HISTORY: Son with coronary artery disease.   REVIEW OF SYSTEMS:  GENERAL: Generalized weakness. No change in vision.  HEENT: No sore throat, decreased hearing.  RESPIRATORY: No shortness of breath.  CARDIOVASCULAR: No chest pain, palpitations or shortness of breath with exertion.  GASTROINTESTINAL: Has a good appetite and having regular bowel movements.  GENITOURINARY: No polyuria. No increased frequency of urination. However, has some dysuria.  SKIN: No rash or lesions.  HEMATOLOGIC: No increased bleeding or bruising.  NEUROLOGIC: No weakness in any part of the body or tingling sensation.   PHYSICAL EXAMINATION:  GENERAL: This is a well built, well nourished, age appropriate female lying down in the bed, not in distress.  VITAL SIGNS: Temperature 98.1, pulse 61, blood pressure 121/66, respiratory rate of 18. Oxygen saturation is 98% on room air.  HEENT: Head normocephalic, atraumatic. There is no scleral icterus. Conjunctivae normal. Pupils equal and react to light. Mucous membranes moist.  NECK: Supple. No lymphadenopathy. No JVD. No carotid bruit.  CHEST: No focal tenderness.  LUNGS: Bilaterally clear to auscultation.  HEART: S1 and S2 regular, no  murmurs are heard.  ABDOMEN: Bowel sounds present. Soft, nontender, nondistended.  EXTREMITIES: No pedal edema. Pulses 2+.  NEUROLOGIC: The patient is alert, oriented to self, person but not to place and time. No apparent cranial nerve abnormalities. Moving all 4 extremities.    LABORATORY DATA: UA: 3+ leukocyte esterase, 4 to 5 WBC. Bacteria: None seen. Blood  glucose 173. CMP is completely within normal limits. Troponins less than 0.02. BUN 20, creatinine of 1.43.   CBC: WBC of 7.7, hemoglobin 12.1, platelet count of 230.   EKG 12-lead: T wave inversions in the inferior leads, II, III, aVF, which were resolved on the subsequent EKG.   ASSESSMENT AND PLAN: The patient is an 81 year old female who comes to the Emergency Department after having an episode of syncope.  1.  Syncope. The patient was hypotensive. This might have been a combination of over medication, and there might have been a component of dehydration as well. The patient also has other risk factors of critical significant coronary artery disease with a blockage of 95% of the LAD and 75% of the diagonal. All of this keeps the patient at risk of syncope. Admit the patient to the monitored bed. Continue to cycle cardiac enzymes x 3. We will hold the blood pressure medication. Continue with IV fluids. We will obtain echocardiogram in the morning.  2.  Hypotension. As mentioned above, could be a combination of multiple blood pressure medications as well as dehydration. Will hold Imdur and Losartan for now. Continue the Coreg.  3.  Hypertension. Will hold the losartan and Imdur. Continue with the Coreg.  4.  Coronary artery disease. Continue with aspirin, Coreg, Plavix, lovastatin.  5.  Debility. Will request physical therapy for evaluation of the patient's overall strength. If needed, the patient may benefit getting home health with physical therapy.  6.  Keep the patient on DVT prophylaxis with the Lovenox.  7.  Urinary tract infection. We will obtain urine cultures, start the patient on Rocephin.   TIME SPENT: 40 minutes reviewing the old records, history and physical of the current admission as well as updating the patient's son.   ____________________________ Susa GriffinsPadmaja Vasireddy, MD pv:lo D: 10/16/2012 02:31:07 ET T: 10/16/2012 10:03:53 ET JOB#: [id number]  cc: Susa GriffinsPadmaja Vasireddy, MD,  <Dictator> Duanne Limerickeanna C. Jones, MD  Clerance LavPADMAJA VASIREDDY MD ELECTRONICALLY SIGNED 10/17/2012 7:36

## 2014-11-29 NOTE — Discharge Summary (Signed)
  DATE OF BIRTH:  1924/02/08  DATE OF ADMISSION:  10/16/2012  DATE OF DISCHARGE:  10/17/2012  DISCHARGE DIAGNOSES: Syncopal episode. Evidence of hypotension and mild renal impairment on admission. Urinary tract infection. Echocardiogram showed diastolic left systolic chronic congestive heart failure. Hypertension. Coronary artery disease.   PRIMARY CARE PHYSICIAN:  Dr. Elizabeth Sauereanna Jones, [facility]  HISTORY OF PRESENT ILLNESS: An 81 year old female with past medical history of diabetes, hypertension, hyperlipidemia, coronary artery disease, presented to Emergency Room with syncopal episode. Sitting in a chair outside of the house, stood up, walked to the house, and had loss of consciousness, fell down to the floor. The patient's son witnessed the event. Stated loss of consciousness was 1 to 2 minutes. Regained consciousness by herself. Her blood pressure was in the 90s and EKG showed T-wave inversion in inferior leads. She was given IV fluid. Repeat EKG shows dissolution of the EKG changes. She is a poor historian. As per the family, the patient had not been eating or drinking enough for the last 2 days, as they did not have power at home. Was also found having urinary tract infection, 3+ leukocyte esterase, and WBC of 5, so she was started on treatment of UTI with hydration.   HOSPITAL COURSE AND STAY:   1.  Syncopal episode. She felt significantly better after IV hydration, and her EKG changes resolved. Her 3 troponins remained negative, and echocardiogram showed systolic plus diastolic chronic failure, no acute exacerbation. Hypotension:  As mentioned in presentation, she was hypotensive. She was on multiple blood pressure medication, as well as she was dehydrated, had mild renal insufficiency, which was corrected, and she remained stable. We held her blood pressure medications Imdur and losartan, and continued on Coreg. Mild renal insufficiency, which was present on admission, corrected after IV  fluid.   2.  Coronary artery disease. We continued her aspirin, Coreg and Plavix.  3.  Debility. We requested Physical Therapy consult, and they suggested home health aide. We arranged for that on discharge.  4.  Urinary tract infection. Culture was contamination. She was on Rocephin, and we continued that on discharge.  CONSULTS DURING HOSPITAL STAY:  None.  LAB RESULTS IN THE HOSPITAL: On presentation, total WBC 7.7, hemoglobin 12.1, creatinine was 1.40. Troponin was less than 0.02. Urinalysis was positive, with 45 WBCs and 3+ leukocyte esterase. Troponin level remained negative on further followup x 2. Urinalysis suggestive of contamination, and creatinine level came down to 1.02 on further followup. Echocardiogram was done, it suggested ejection fraction 45% to 50%, mildly decreased systolic function, impaired relaxation of the left ventricular filling pressure.   CONDITION ON DISCHARGE:  Stable.   CODE STATUS ON DISCHARGE:  FULL CODE.   MEDICATIONS ON DISCHARGE:  Lovastatin 10 mg oral tablet once a day, omeprazole 20 mg 2 times a day, aspirin 81 mg once a day, Plavix 75 mg once a day, carvedilol 3.125 mg 2 times a day, Home health on discharge, yes. Home oxygen, no. Diet on discharge, low sodium. Diet consistency, regular. Activity limitation as tolerated. Time frame to followup, within 1 to 2 weeks with primary care physician.   Total time spent in this discharge:  40 minutes.     ____________________________ Hope PigeonVaibhavkumar G. Elisabeth PigeonVachhani, MD vgv:mr D: 10/20/2012 23:12:53 ET T: 10/21/2012 15:17:28 ET JOB#: [id number]  cc: Hope PigeonVaibhavkumar G. Elisabeth PigeonVachhani, MD, <Dictator> Duanne Limerickeanna C. Jones, MD  Altamese DillingVAIBHAVKUMAR VACHHANI MD ELECTRONICALLY SIGNED 11/06/2012 9:29

## 2014-11-29 NOTE — Consult Note (Signed)
PATIENT NAME:  Tonya Bolton, Tonya Bolton MR#:  [id number] DATE OF BIRTH:  01-10-1924  DATE OF CONSULTATION:  05/14/2013  REFERRING PHYSICIAN:   CONSULTING PHYSICIAN:  Lamar BlinksBruce J. Kayelee Herbig, MD  REASON FOR CONSULTATION: Syncope with known coronary artery disease.   CHIEF COMPLAINT: "I passed out."  HISTORY OF PRESENT ILLNESS: This is an [age] year old female with known coronary artery disease by cardiac catheterization last year with multiple coronary artery disease lesions medically managed at this time. The patient is on appropriate medication management including carvedilol and ACE inhibitor. The patient has had an episode of syncope last year but has done fairly well at this time and so she was at the dining room table, felt dizzy, and then passed out and hurt herself. There was no evidence of chest pain at the time or other convulsions.   The patient has had a normal troponin, CK-MB without evidence of myocardial infarction. EKG shows normal sinus rhythm, otherwise normal EKG.   The patient feels fine at this point with no evidence of congestive heart failure-type symptoms.   REVIEW OF SYSTEMS: The remainder of review of systems cannot be assessed due to mild amount of dementia and somewhat being weak.   PAST MEDICAL HISTORY:  1.  Coronary artery disease.  2.  Hypertension.  3.  Hyperlipidemia.   FAMILY HISTORY: No family members with early onset of cardiovascular disease or hypertension.   SOCIAL HISTORY: Currently denies alcohol or tobacco use.   ALLERGIES: AS LISTED.   MEDICATIONS: As listed.   PHYSICAL EXAMINATION:  VITAL SIGNS: Blood pressure 110/68 bilaterally, heart rate 70 upright, reclining, and regular.  GENERAL: A well-appearing female in no acute distress.  HEENT: No icterus, thyromegaly, ulcers, hemorrhage, or xanthelasma.  CARDIOVASCULAR: Regular rate and rhythm with normal S1 and S2 with a 2/6 apical murmur consistent with mitral regurgitation. PMI is normal in size and  placement.  NECK: Carotid upstroke normal without bruit. Jugular venous pressure is normal.  LUNGS: Few basilar crackles with normal respirations.  ABDOMEN: Soft, nontender, without hepatosplenomegaly or masses. Abdominal aorta is normal size without bruit.  EXTREMITIES: Show 2+ radial, femoral, dorsal pedal pulses with no lower extremity edema, cyanosis, clubbing or ulcers.  NEUROLOGIC: Not oriented to time, place or person but does have normal mood and affect.   ASSESSMENT: An 81 year old female with coronary artery disease, hypertension, hyperlipidemia, having an episode of syncope of unknown etiology with no current evidence of myocardial infarction and/or rhythm disturbances.   RECOMMENDATIONS:  1.  Telemetry with telemetry unit nursing following for any rhythm disturbances or myocardial infarction.  2.  Echocardiogram for left ventricular systolic dysfunction or valvular heart disease contributing tosx 3.  Stress test for further evaluation of worsening myocardial ischemia and/or infarct.  4.  Carotid Dopplers for carotid atherosclerosis causing above.  5.  Consider decreasing carvedilol if positive for  bradycardia and/or hypotension  6.  Begin ambulation and further treatment options after above.   ____________________________ Lamar BlinksBruce J. Tequilla Cousineau, MD bjk:np D: 05/14/2013 17:38:49 ET T: 05/14/2013 18:45:56 ET JOB#: [id number]  cc: Lamar BlinksBruce J. Nori Winegar, MD, <Dictator> Lamar BlinksBRUCE J Regene Mccarthy MD ELECTRONICALLY SIGNED 05/22/2013 8:14

## 2014-11-29 NOTE — H&P (Signed)
PATIENT NAME:  Tonya Bolton, Tonya Bolton MR#:  [id number] DATE OF BIRTH:  1923/08/12  DATE OF ADMISSION:  05/14/2013  PRIMARY CARE PHYSICIAN: Elizabeth Sauer, MD  CARDIOLOGIST:  Dorothyann Peng, MD  HISTORY OF PRESENT ILLNESS: The patient is an 81 year old Caucasian female with past medical history significant for history of coronary artery disease status post cardiac catheterization done in 2010 by Dr. Juliann Pares which revealed 95% obstructive LAD lesion, presents to the hospital with complaints of recurrent syncope. According to the patient, she was doing well up until today in the morning when she was walking at home, doing some light work and suddenly she became lightheaded and dizzy, felt that her eyes were blurry. She felt very weak. She had to sit down. However, while she was sitting in the chair, she just fell out of the chair and lost consciousness. She does not remember falling or hitting the floor. She was dizzy and presyncopal prior to fall; however, she denied any chest pain or significant shortness of breath. She hit her head and developed subcutaneous hematoma in the right forehead. Was brought to the Emergency Room for further evaluation. In the Emergency Room, her vital signs were unremarkable. Her labs were also relatively benign with mild anemia, hemoglobin level about the same as in March 14, and also hyponatremia. The patient reports having 1 episode of loose bowel movements whenever she was trying to get up from the floor. She did not have any bowel movements or diarrhea yesterday. Usually she is constipated.  PAST MEDICAL HISTORY: Significant for admission in March 2014 for a syncopal episode. At that time, she also had mild renal insufficiency with diagnosis of urinary tract infection. She had an echocardiogram done on the same admission, in March 2014, which revealed ejection fraction of 45% to 50%, mildly decreased global left ventricular systolic function, impaired relaxation pattern of left  ventricular diastolic filling, mild concentric left ventricular hypertrophy, moderate mitral valve regurg, mild aortic valve sclerosis without stenosis. Past medical history is also significant for hypertension and coronary artery disease as well as diabetes mellitus. Status post cardiac catheterization in 2010. According to medical records, she had a 95% occlusion in the LAD, also 75% in first diagonal. Other vessels were 25% stenosis, according to Dr. Juliann Pares. At that time, she was recommended medical management. She also has history of dementia and history of diabetes mellitus; however, she is off any diabetic medications.   HOME MEDICATIONS: According to medical records, the patient is on: 1.  Aspirin 81 mg p.o. daily. 2.  Carvedilol 3.125 mg twice daily. 3.  Plavix 75 mg daily. 4.  Isosorbide mononitrate 10 mg twice daily. 5.  Lovastatin 10 mg p.o. at bedtime. 6.  Omeprazole 20 mg p.o. daily as needed.   ALLERGIES: PENICILLIN.   SOCIAL HISTORY: No history of smoking, alcohol abuse or illicit drugs. Lives with her 2 sons as well as nephew.   FAMILY HISTORY: The patient's son has coronary artery disease.   REVIEW OF SYSTEMS: Positive for syncope, feeling presyncopal prior to fall, having some cold chills in the morning, some fatigue and weakness for the past few days. She tells me that she is not sleeping well at nighttime, wakes up to go to the bathroom at least 2 or 3 times at night and she cannot rest. She admits of weight loss, approximately 40 pounds in the past 3 months. Admits of having some blurring vision early in the morning before fall. Has cataracts which are not being operated. Also has  some sinus congestion. Admits to some chest pains with exertion in the past. Admits of having some nausea today as well as 1 episode of diarrhea today. Usually she has problems with constipation. She admits of getting up at nighttime, a few times at night, for urination. Admits of having some  intermittent dysuria symptoms. Denies any incontinence. CONSTITUTIONAL: Denies any fevers or chills. Denies any significant pains, weight gain. EYES: Denies any double vision, glaucoma.  ENT: Denies any tinnitus, allergies, epistaxis, sinus pain, dentures, difficulty swallowing.  RESPIRATORY: Denies any cough, wheezes, asthma, COPD. CARDIOVASCULAR: Denies any orthopnea, edema, arrhythmias, or palpitations. Admits of syncope.  GASTROINTESTINAL: Denies any abdominal pains, vomiting, rectal bleeding, change in bowel habits.  GENITOURINARY: Denies any hematuria or incontinence.  ENDOCRINOLOGY: Denies any polydipsia, nocturia, thyroid problems, heat or cold intolerance or thirst. HEMATOLOGIC: Denies any anemia, easy bruising, bleeding or swollen glands.  SKIN: Denies any acne, rashes, lesions or change in moles.  MUSCULOSKELETAL: Denies arthritis, cramps, swelling.  NEUROLOGIC: Denies numbness, epilepsy or tremor.  PSYCHIATRIC: Denies anxiety, insomnia.   PHYSICAL EXAMINATION: VITAL SIGNS: On arrival to the hospital, temperature is 97.7, pulse was 61, respiration rate was 20, blood pressure 201/74 and saturation was 98% on room air.  GENERAL: This is a well-developed, well-nourished Caucasian female in no significant distress, lying on the stretcher.  HEENT: Her pupils are equal, reactive to light. Extraocular movements intact. No icterus or conjunctivitis. Normal hearing. No pharyngeal erythema. Mucosa is dry.  NECK: No masses. Supple and nontender. Thyroid is not enlarged. No adenopathy. No JVD or carotid bruits bilaterally. Full range of motion. The patient does have significant subcutaneous bruise and bleed in the right forehead area. She also has subcutaneous bleeding and swelling just proximal to the right wrist.  LUNGS: Clear to auscultation in all fields. No rales, rhonchi or diminished breath sounds. No wheezing or labored inspiration, decreased effort, dullness to percussion or overt  respiratory distress.  HEART: S1 and S2 appreciated. No murmurs, gallops or rubs were noted. PMI not lateralized, distant. Chest is nontender to palpation. 1+ pedal pulses. No lower extremity edema, calf tenderness or cyanosis was noted.  ABDOMEN: Soft, mildly uncomfortable in right lower quadrant on palpation; however, no rebound or guarding was noted. No hepatosplenomegaly or masses were noted.  RECTAL: Deferred.  MUSCLE STRENGTH: Able to move all extremities. No cyanosis, degenerative joint disease or kyphosis. Gait was not tested.  SKIN: Did not reveal any rashes, lesions, erythema, nodularity or induration. It was warm and dry to palpation. As mentioned above, the patient does have close to right wrist swelling as well as subcutaneous hematoma.  LYMPHATIC: No adenopathy in the cervical region.  NEUROLOGIC: Cranial nerves grossly intact. Sensory is intact. The patient is alert and oriented to person, place. Cooperative. Memory is good. No significant confusion, agitation or depression. The patient is very weak. Even sitting up requires assistance.  LABORATORY AND DIAGNOSTICS: The patient's EKG revealed normal sinus rhythm at 61 beats per minute, normal axis. No acute ST-T changes were noted.  On 6th of October 2014: Glucose 123 and sodium 130. Otherwise BMP was unremarkable. The patient's albumin level is low at 3.1, otherwise liver enzymes are normal. Cardiac enzymes, first set, negative. CBC: White blood cell count is 8.7, hemoglobin 11.4 and platelet count 209. Coagulation panel normal at 14.1 and INR of 1.1. Pro time was 14.1 and INR was 1.1. Urinalysis: Yellow cloudy urine, negative for glucose or bilirubin, trace ketones, specific gravity 1.013, pH was 8.0, negative  for blood, protein, nitrites or leukocyte esterase, 2 red blood cells, 3 white blood cells, no bacteria and less than 1 epithelial cell was noted as well as mucus was present.   CT scan of head without contrast, 6th of October  2014, revealed severe atrophy, but stable head from March 2014. Soft tissue swelling in the right frontal region was noted, but no underlying fracture was observed.  ASSESSMENT AND PLAN: 1.  Syncope and collapse. Admit the patient to the medical floor to telemetry. Check orthostatic vital signs. I suspect it could be an element of dehydration; however, we cannot rule out significant severe coronary artery disease. As the patient presents with recurrent syncope, I am going to ask the cardiologist, Dr. Juliann Pares, to see the patient. Echo was done on last admission. We will get carotid ultrasound if it was not done before.  2.  Diarrhea, 1 episode. We will get stool cultures if it recurs. 3.  Hyponatremia. Suspect mild dehydration. We will continue IV fluids. Follow sodium level.  4.  Anemia. Get guaiac.  5.  Coronary artery disease. Continue Plavix.  6.  Malignant hypertension, possibly stress related. However, we will be watching the patient's blood pressure readings closely and will initiate her on medications to control her blood pressure.   TIME SPENT: 50 minutes. ____________________________ Katharina Caper, MD rv:sb D: 05/14/2013 15:30:15 ET T: 05/14/2013 16:03:11 ET JOB#: [id number]  cc: Katharina Caper, MD, <Dictator> Duanne Limerick, MD Therisa Mennella MD ELECTRONICALLY SIGNED 05/22/2013 12:32

## 2014-11-29 NOTE — Discharge Summary (Signed)
PATIENT NAME:  Tonya Bolton, Tonya Bolton MR#:  [id number] DATE OF BIRTH:  1923/12/06  ADMISSION DIAGNOSIS: Syncope.   DISCHARGE DIAGNOSES:  1.  Syncope, likely secondary to dehydration versus vasovagal.  2.  Hyponatremia secondary to dehydration and hypovolemia.  3.  Hypertension.  4.  Hyperlipidemia.   CONSULTATIONS: Dr. Nehemiah Massed from cardiology.   PROCEDURES: The patient underwent a nuclear medicine stress test on 05/15/2013 which showed no significant wall-motion abnormality. No evidence of ischemia. EF of  64% .   Carotid ultrasound showed no evidence of ischemia.   Troponins x3 were all negative.   Sodium 133, potassium 4.3, chloride 101, bicarb 25, BUN 8, creatinine 0.9 , glucose 79, calcium 8.7, bilirubin 0.4, alk phos 74, AST 10, ALT 12, total protein 5.7, albumin 2.7.  White blood cell count 5.6, hemoglobin 11, hematocrit 33, platelets 201.   HOSPITAL COURSE: This is a very pleasant 81 year old female who presented with an episode of syncope and a fall with resultant forehead subcutaneous hematoma. For further details, please refer to the H and P. 1.  Syncope, likely secondary to volume depletion versus vasovagal. She did present with low sodium levels. She had  no acute telemetry changes. She had a normal EKG. She underwent a carotid ultrasound which essentially was normal as well. She had an echocardiogram which showed an ejection fraction of 45% with mild global LV systolic dysfunction. She underwent a nuclear medicine stress test during this hospitalization which showed a normal LV function and normal perfusion. The etiology of the syncope was not felt to be cardiac in nature given these facts.  2.  Hyponatremia from volume depletion, which is resolved with IV fluids.  3.  Hypertension. The patient will continue outpatient medications.  4.  Hyperlipidemia. The patient is currently on lovastatin.   DISCHARGE MEDICATIONS:  1.  Plavix 75 mg daily.  2.  Coreg 3.125 b.i.d.  3.  Aspirin 81  mg daily.  4.  Lovastatin 10 mg daily.  5.  Omeprazole 20 mg as needed for acid reflux symptoms.  6.  Imdur 10 mg b.i.d.   DISPOSITION: Discharge to home health with physical therapy and nurse.   DISCHARGE DIET: Low sodium.   DISCHARGE ACTIVITY: As tolerated.   FOLLOW-UP: Dr. Serafina Royals in 2 weeks as well as with Dr. Otilio Miu in 1 week.   TIME SPENT: Approximately 35 minutes.  CONDITION ON DISCHARGE: Medically stable for discharge.    ____________________________ Anairis Knick P. Benjie Karvonen, MD spm:np D: 05/15/2013 20:52:33 ET T: 05/15/2013 21:48:24 ET JOB#: [id number]  cc: Harrison Paulson P. Benjie Karvonen, MD, <Dictator> Corey Skains, MD Juline Patch, MD Makhya Arave P Lesle Faron MD ELECTRONICALLY SIGNED 05/16/2013 21:02

## 2014-11-30 NOTE — H&P (Signed)
PATIENT NAME:  Tonya Bolton, Tonya Bolton MR#:  [id number] DATE OF BIRTH:  Jun 10, 1924  DATE OF ADMISSION:  09/24/2013  PRIMARY CARE PHYSICIAN: Elizabeth Sauereanna Jones, MD   CHIEF COMPLAINT: Shortness of breath and weakness.   HISTORY OF PRESENT ILLNESS: This is an 81 year old female who presents to the Emergency Room today due to some shortness of breath and weakness. The patient says she woke up this morning, felt that she was short of breath. She also has been feeling increasingly weak now for the past few weeks to months. She therefore came to the ER for further evaluation. In the Emergency Room, the patient was noted to be acutely hyponatremic with sodium of 127. Hospitalist services were contacted for further treatment and evaluation. The patient'Bolton shortness of breath was acute in nature, but not associated with any chest pain, nausea, vomiting, diaphoresis, palpitations or any other associated symptoms presently.   REVIEW OF SYSTEMS: CONSTITUTIONAL: No documented fever. No weight gain. No weight loss.  EYES: No blurred or double vision.  ENT: No tinnitus. No postnasal drip. No redness of the oropharynx.  RESPIRATORY: No cough, no wheeze, no hemoptysis. Positive dyspnea.  CARDIOVASCULAR: No chest pain, no orthopnea, no palpitations, no syncope.  GASTROINTESTINAL: No nausea, no vomiting, no diarrhea, no abdominal pain. No melena or hematochezia.  GENITOURINARY: No dysuria or hematuria.  ENDOCRINE: No polyuria or nocturia. No heat or intolerance.  HEMATOLOGIC: No anemia, no bruising, no bleeding.  INTEGUMENTARY: No rashes. No lesions.  MUSCULOSKELETAL: No arthritis, no swelling, no gout.  NEUROLOGIC: No numbness or tingling. No ataxia. No seizure activity.  PSYCHIATRIC: No anxiety, no insomnia, no ADD.   PAST MEDICAL HISTORY: Consistent with coronary artery disease, hypertension, GERD, and dementia.   ALLERGIES: SULFA DRUGS, WHICH CAUSES HIVES.   SOCIAL HISTORY: No smoking. No alcohol abuse. No illicit drug  abuse. Lives at home with her son and grandson.   FAMILY HISTORY: Both mother and father are deceased. Mother died from complications of a stroke. Father committed suicide.   CURRENT MEDICATIONS: Aspirin 81 mg daily, Coreg 3.125 mg b.i.d., Plavix 75 mg daily, lovastatin 10 mg daily, omeprazole 20 mg daily.   PHYSICAL EXAMINATION: Presently is as follows:  VITAL SIGNS: Temperature 97.5, pulse 66, respirations 18, blood pressure 158/79, sats 99% on room air.  GENERAL: She is a pleasant-appearing female in no apparent distress.  HEENT: Atraumatic, normocephalic. Extraocular muscles are intact. Pupils are equal and reactive to light. Sclerae anicteric. No conjunctival injection. No pharyngeal erythema.  NECK: Supple. There is no jugular venous distention. No bruits, no lymphadenopathy, no thyromegaly.  HEART: Regular rate and rhythm. No murmurs. No rubs. No clicks.  LUNGS: Clear to auscultation bilaterally. No rales, no rhonchi, and no wheezes.  ABDOMEN: Soft, flat, nontender, and nondistended. Has good bowel sounds. No hepatosplenomegaly appreciated.  EXTREMITIES: No evidence of any cyanosis, clubbing, or peripheral edema. Has +2 pedal and radial pulses bilaterally.  NEUROLOGIC: The patient is alert, awake, and oriented x3, globally weak, moves all extremities spontaneously. No other focal motor or sensory deficits appreciated bilaterally.  SKIN: Moist and warm with no rashes appreciated.  LYMPHATIC: There is no cervical or axillary lymphadenopathy.   LABORATORY AND DIAGNOSTICS: Serum glucose 108, BUN 9, creatinine 0.8, sodium 127, potassium 4.7, chloride 96, bicarb 27. Troponin less than 0.02. White cell count 6.8, hemoglobin 10.9, hematocrit 32.6, platelet count 252.   The patient'Bolton chest x-ray showed no evidence of any acute cardiopulmonary disease.   ASSESSMENT AND PLAN: This is an 81 year old female  with a history of hypertension, coronary artery disease, gastroesophageal reflux disease, and  dementia who presents to the hospital due to shortness of breath and generalized weakness and noted to be acutely hyponatremic. 1.  Hyponatremia. This is likely hypovolemic hyponatremia due to poor p.o. intake. I will hydrate the patient with IV fluids and follow her sodium in the morning. I will check a urine and serum osmolality.  2.  Generalized weakness. Unclear if this is related to hyponatremia versus chronic deconditioning given her dementia. I will get a physical therapy consult to assess her mobility.  3.  History of coronary artery disease. The patient did present with shortness of breath, but the EKG shows no acute ST changes. Her chest x-ray is negative. Her troponin is negative. She clinically feels fine now and has no chest pain. I will continue her aspirin, Plavix, beta blocker, and statin for now.  4.  Gastroesophageal reflux disease. Continue with her Protonix.   CODE STATUS: The patient is a FULL code.   TIME SPENT: 45 minutes.   ____________________________ Rolly Pancake. Cherlynn Kaiser, MD vjs:sb D: 09/24/2013 16:04:42 ET T: 09/24/2013 16:17:49 ET JOB#: [id number]  cc: Rolly Pancake. Cherlynn Kaiser, MD, <Dictator> Houston Siren MD ELECTRONICALLY SIGNED 09/24/2013 19:07

## 2014-11-30 NOTE — Discharge Summary (Signed)
PATIENT NAME:  Tonya Bolton, [name] MR#:  [id number] DATE OF BIRTH:  09-21-1923  DATE OF ADMISSION:  09/24/2013 DATE OF DISCHARGE:  09/26/2013  For a detailed note, please take a look at the history and physical done on admission by me.   DIAGNOSES AT DISCHARGE: As follows:  1.  Acute hyponatremia now improved. 2.  Generalized weakness, likely multifactorial.  3.  Urinary tract infection.  4.  History of coronary artery disease. 5.  Gastroesophageal reflux disease.  The patient is being discharged home with home health physical therapy, nursing and social work services.   ACTIVITY: As tolerated.  FOLLOWUP:  Dr. Elizabeth Sauereanna Jones in the next 1 to 2 weeks.  DIET:  The patient is being discharged on a low-sodium, regular consistency diet.   DISCHARGE MEDICATIONS: Plavix 75 mg daily, Coreg 3.125 mg b.i.d., aspirin 81 mg daily, lovastatin 10 mg at bedtime, omeprazole 20 mg daily and ciprofloxacin 250 mg b.i.d.   BRIEF HOSPITAL COURSE: This is an 81 year old female with medical problems as mentioned above presented to the hospital with generalized weakness and also noted to be acutely hyponatremic.   PROBLEM: 1.  Acute hyponatremia. The most likely cause of the hyponatremia was hypovolemic hyponatremia. The patient was given some gentle IV fluids. The sodium did improve, and further needs to be followed as an outpatient. The patient is currently not on any diuretics. She is encouraged to increase her fluid intake and take p.o. more. Her sodium on the day of discharge is 129. 2.  Generalized weakness. The likely source of this is multifactorial, likely related to underlying hyponatremia complicated with urinary tract infection and deconditioning. The patient is being discharged on oral antibiotics for her urinary tract infection. She was seen by physical therapy. They thought she would benefit from short-term rehab although she is under observation services, therefore cannot be discharged to rehab at this  time. The best we can do is to arrange home health services; therefore, home health physical therapy, nursing and social work services are being arranged for her.  3.  Urinary tract infection. The patient is being discharged on oral ciprofloxacin. Her urine cultures are negative.  4.  History of coronary artery disease. The patient is being discharged back on her aspirin, Plavix and beta blocker.  5.  Gastroesophageal reflux disease. The patient was maintained on her Protonix and she will resume that.   CODE STATUS: The patient is a full code.   TIME SPENT: 40 minutes  ____________________________ Rolly PancakeVivek J. Cherlynn KaiserSainani, MD vjs:ce D: 09/26/2013 16:23:08 ET T: 09/26/2013 18:52:29 ET JOB#: [id number]  cc: Rolly PancakeVivek J. Cherlynn KaiserSainani, MD, <Dictator> Duanne Limerickeanna C. Jones, MD Houston SirenVIVEK J Delailah Spieth MD ELECTRONICALLY SIGNED 10/05/2013 18:04
